# Patient Record
Sex: Male | Born: 1960 | Race: White | Hispanic: No | Marital: Married | Smoking: Former smoker
Health system: Southern US, Community
[De-identification: ages and names within clinical notes are randomized; demographics above are authoritative.]

## PROBLEM LIST (undated history)

## (undated) DIAGNOSIS — J45909 Unspecified asthma, uncomplicated: Secondary | ICD-10-CM

## (undated) DIAGNOSIS — E119 Type 2 diabetes mellitus without complications: Secondary | ICD-10-CM

## (undated) DIAGNOSIS — E7800 Pure hypercholesterolemia, unspecified: Secondary | ICD-10-CM

## (undated) DIAGNOSIS — I10 Essential (primary) hypertension: Secondary | ICD-10-CM

## (undated) HISTORY — PX: KNEE ARTHROSCOPY WITH PATELLAR TENDON REPAIR: SHX5656

## (undated) HISTORY — DX: Unspecified asthma, uncomplicated: J45.909

## (undated) HISTORY — PX: FOOT SURGERY: SHX648

## (undated) HISTORY — PX: WISDOM TOOTH EXTRACTION: SHX21

---

## 2016-09-12 ENCOUNTER — Emergency Department (HOSPITAL_COMMUNITY): Payer: 59

## 2016-09-12 ENCOUNTER — Emergency Department (HOSPITAL_COMMUNITY)
Admission: EM | Admit: 2016-09-12 | Discharge: 2016-09-12 | Disposition: A | Discharge status: Home / Self Care | Payer: 59 | Attending: Emergency Medicine | Admitting: Emergency Medicine

## 2016-09-12 ENCOUNTER — Encounter (HOSPITAL_COMMUNITY): Payer: Self-pay

## 2016-09-12 DIAGNOSIS — Y929 Unspecified place or not applicable: Secondary | ICD-10-CM | POA: Insufficient documentation

## 2016-09-12 DIAGNOSIS — R0789 Other chest pain: Secondary | ICD-10-CM | POA: Diagnosis present

## 2016-09-12 DIAGNOSIS — I10 Essential (primary) hypertension: Secondary | ICD-10-CM | POA: Insufficient documentation

## 2016-09-12 DIAGNOSIS — X58XXXA Exposure to other specified factors, initial encounter: Secondary | ICD-10-CM | POA: Diagnosis not present

## 2016-09-12 DIAGNOSIS — Y990 Civilian activity done for income or pay: Secondary | ICD-10-CM | POA: Diagnosis not present

## 2016-09-12 DIAGNOSIS — Z7984 Long term (current) use of oral hypoglycemic drugs: Secondary | ICD-10-CM | POA: Insufficient documentation

## 2016-09-12 DIAGNOSIS — Y9301 Activity, walking, marching and hiking: Secondary | ICD-10-CM | POA: Diagnosis not present

## 2016-09-12 DIAGNOSIS — E119 Type 2 diabetes mellitus without complications: Secondary | ICD-10-CM | POA: Insufficient documentation

## 2016-09-12 DIAGNOSIS — Z7982 Long term (current) use of aspirin: Secondary | ICD-10-CM | POA: Diagnosis not present

## 2016-09-12 DIAGNOSIS — R079 Chest pain, unspecified: Secondary | ICD-10-CM

## 2016-09-12 HISTORY — DX: Pure hypercholesterolemia, unspecified: E78.00

## 2016-09-12 HISTORY — DX: Essential (primary) hypertension: I10

## 2016-09-12 HISTORY — DX: Type 2 diabetes mellitus without complications: E11.9

## 2016-09-12 LAB — CBC
HCT: 47.2 % (ref 39.0–52.0)
Hemoglobin: 15.8 g/dL (ref 13.0–17.0)
MCH: 28.4 pg (ref 26.0–34.0)
MCHC: 33.5 g/dL (ref 30.0–36.0)
MCV: 84.9 fL (ref 78.0–100.0)
Platelets: 187 10*3/uL (ref 150–400)
RBC: 5.56 MIL/uL (ref 4.22–5.81)
RDW: 12.9 % (ref 11.5–15.5)
WBC: 6.8 10*3/uL (ref 4.0–10.5)

## 2016-09-12 LAB — BASIC METABOLIC PANEL
Anion gap: 8 (ref 5–15)
BUN: 13 mg/dL (ref 6–20)
CO2: 29 mmol/L (ref 22–32)
Calcium: 9.3 mg/dL (ref 8.9–10.3)
Chloride: 103 mmol/L (ref 101–111)
Creatinine, Ser: 0.89 mg/dL (ref 0.61–1.24)
GFR calc Af Amer: >60 mL/min (ref >60)
GFR calc non Af Amer: >60 mL/min (ref >60)
Glucose, Bld: 149 mg/dL — ABNORMAL HIGH (ref 65–99)
Potassium: 4 mmol/L (ref 3.5–5.1)
Sodium: 140 mmol/L (ref 135–145)

## 2016-09-12 LAB — I-STAT TROPONIN, ED
Troponin i, poc: 0 ng/mL (ref 0.00–0.08)
Troponin i, poc: 0 ng/mL (ref 0.00–0.08)

## 2016-09-12 MED ORDER — ASPIRIN 81 MG PO CHEW
324.0000 mg | CHEWABLE_TABLET | Freq: Once | ORAL | Status: AC
  Administered 2016-09-12: 324 mg via ORAL
  Filled 2016-09-12: qty 4

## 2016-09-12 MED ORDER — NITROGLYCERIN 0.4 MG SL SUBL
0.4000 mg | SUBLINGUAL_TABLET | SUBLINGUAL | Status: DC | PRN
  Administered 2016-09-12: 0.4 mg via SUBLINGUAL
  Filled 2016-09-12: qty 1

## 2016-09-12 MED ORDER — ASPIRIN EC 81 MG PO TBEC: 81.0000 mg | DELAYED_RELEASE_TABLET | Freq: Every day | ORAL | 0 refills | Status: AC

## 2016-09-12 NOTE — Discharge Instructions (Signed)
Read the information below.  You may return to the Emergency Department at any time for worsening condition or any new symptoms that concern you.   If you develop worsening chest pain, shortness of breath, fever, you pass out, or become weak or dizzy, return to the ER for a recheck.    °

## 2016-09-12 NOTE — ED Provider Notes (Signed)
[facility]-EMERGENCY DEPT Provider Note   CSN: [id number] Arrival date & time: 09/12/16  1428     History   Chief Complaint Chief Complaint  Patient presents with  . Chest Pain  . Dizziness    HPI Vincent Hawkins is a 55 y.o. male.  HPI   Patient with hx HTN, HLD, DM p/w left sided chest tightness, left arm tingling, lightheadedness that occurred while he was walking around at work, at 2:30pm.  He has improved somewhat but continues to have mild tightness in the left chest.  No exacerbating or palliative symptoms.  Has had these symptoms previously 12 years ago, had treadmill stress test at the time that was negative.    Family hx GF with 3 MIs in his early 31s Denies SOB, orthopnea, leg swelling, recent immobilization, personal or family hx blood clots.     PCP [facility]     Past Medical History:  Diagnosis Date  . Diabetes mellitus without complication (HCC)   . High cholesterol   . Hypertension     There are no active problems to display for this patient.   Past Surgical History:  Procedure Laterality Date  . FOOT SURGERY Right        Home Medications    Prior to Admission medications   Medication Sig Start Date End Date Taking? Authorizing Provider  ASA-APAP-Salicyl-Caff (PAINAID PO) Take 2 tablets by mouth daily.   Yes Historical Provider, MD  Aspirin-Caffeine (BC FAST PAIN RELIEF PO) Take 1 packet by mouth daily as needed. Once daily as needed   Yes Historical Provider, MD  atorvastatin (LIPITOR) 40 MG tablet Take 40 mg by mouth daily.   Yes Historical Provider, MD  linagliptin (TRADJENTA) 5 MG TABS tablet Take 5 mg by mouth daily.   Yes Historical Provider, MD  metFORMIN (GLUCOPHAGE) 1000 MG tablet Take 1,000 mg by mouth 2 (two) times daily with a meal.   Yes Historical Provider, MD  Multiple Vitamins-Minerals (MULTIVITAMIN ADULT PO) Take 1 tablet by mouth daily.   Yes Historical Provider, MD  ramipril (ALTACE) 5 MG capsule Take 5 mg by mouth daily.    Yes Historical Provider, MD  aspirin EC 81 MG tablet Take 1 tablet (81 mg total) by mouth daily. 09/12/16   Trixie Dredge, PA-C    Family History History reviewed. No pertinent family history.  Social History Social History  Substance Use Topics  . Smoking status: Never Smoker  . Smokeless tobacco: Never Used  . Alcohol use Yes     Allergies   Codeine   Review of Systems Review of Systems  All other systems reviewed and are negative.    Physical Exam Updated Vital Signs BP 137/91   Pulse 76   Temp 97.9 F (36.6 C) (Oral)   Resp 10   Ht 5\' 10"  (1.778 m)   Wt 93 kg   SpO2 96%   BMI 29.41 kg/m   Physical Exam  Constitutional: He appears well-developed and well-nourished. No distress.  HENT:  Head: Normocephalic and atraumatic.  Neck: Neck supple.  Cardiovascular: Normal rate, regular rhythm and intact distal pulses.   Pulmonary/Chest: Effort normal and breath sounds normal. No respiratory distress. He has no wheezes. He has no rales.  Abdominal: Soft. He exhibits no distension and no mass. There is no tenderness. There is no rebound and no guarding.  Musculoskeletal: He exhibits no edema.  Neurological: He is alert. He exhibits normal muscle tone.  Skin: He is not diaphoretic.  Nursing note  and vitals reviewed.    ED Treatments / Results  Labs (all labs ordered are listed, but only abnormal results are displayed) Labs Reviewed  BASIC METABOLIC PANEL - Abnormal; Notable for the following:       Result Value   Glucose, Bld 149 (*)    All other components within normal limits  CBC  I-STAT TROPOININ, ED  I-STAT TROPOININ, ED  Rosezena SensorI-STAT TROPOININ, ED    EKG  EKG Interpretation  Date/Time:  Tuesday September 12 2016 17:39:27 EDT Ventricular Rate:  85 PR Interval:    QRS Duration: 91 QT Interval:  363 QTC Calculation: 432 R Axis:   1 Text Interpretation:  Sinus rhythm Since last tracing of earlier today No significant change was found Confirmed by Effie ShyWENTZ   MD, ELLIOTT [phone]([id number]) on 09/12/2016 6:00:25 PM       Radiology Dg Chest 2 View  Result Date: 09/12/2016 CLINICAL DATA:  Chest pain EXAM: CHEST  2 VIEW COMPARISON:  None. FINDINGS: The heart size and mediastinal contours are within normal limits. Both lungs are clear. The visualized skeletal structures are unremarkable. IMPRESSION: No active cardiopulmonary disease. Electronically Signed   By: Marlan Palauharles  Clark M.D.   On: 09/12/2016 15:47    Procedures Procedures (including critical care time)  Medications Ordered in ED Medications  nitroGLYCERIN (NITROSTAT) SL tablet 0.4 mg (0.4 mg Sublingual Given 09/12/16 1808)  aspirin chewable tablet 324 mg (324 mg Oral Given 09/12/16 1806)     Initial Impression / Assessment and Plan / ED Course  I have reviewed the triage vital signs and the nursing notes.  Pertinent labs & imaging results that were available during my care of the patient were reviewed by me and considered in my medical decision making (see chart for details).  Clinical Course  Comment By Time  Discussed workup with patient and offered patient admission.  He declined.  Heart score is 4.  I have engaged in joint medical decision making with the patient at length, discussing my concerns and the benefits of being admitted to the hospital.  He continues to decline but verbalizes understanding of my concerns.  Prefers to follow up with his PCP tomorrow and will call cardiology for follow up as well.  Willing to stay for second troponin.   Trixie Dredgemily Macklyn Glandon, PA-C 10/24 1904    Pt with HTN, HLD, DM p/w left sided chest pain, left arm tingling, lightheadedness that began at 2:30pm.  Pt has nonischemic EKG, troponins x 2 are negative.  No known risk factors for PE.  HEART score is 4.  Discussed pt with Dr Juleen ChinaKohut.  I have offered pt admission for observation but pt declines.  Please see discussion above.  Pt d/c home with PCP and cardiology follow up.  Strongly encouraged close follow up and return  for any worsening symptoms.  Pt chest pain free for the several hours prior to discharge.  Discussed result, findings, treatment, and follow up  with patient.  Pt given return precautions.  Pt verbalizes understanding and agrees with plan.      Final Clinical Impressions(s) / ED Diagnoses   Final diagnoses:  Chest pain, unspecified type    New Prescriptions New Prescriptions   ASPIRIN EC 81 MG TABLET    Take 1 tablet (81 mg total) by mouth daily.     Trixie Dredgemily Reda Citron, PA-C 09/12/16 2034    Raeford RazorStephen Kohut, MD 09/13/16 [phone]

## 2016-09-12 NOTE — ED Triage Notes (Signed)
Pt c/o L chest tightness, lightheadedness, and tingling in L arm starting this afternoon.  Pain score 3/10.  Pt reports similar symptoms previously w/o diagnosis.  Hx of high cholesterol, HTN, and DM.

## 2016-09-19 ENCOUNTER — Encounter: Payer: Self-pay | Admitting: Physician Assistant

## 2016-09-20 ENCOUNTER — Encounter: Payer: Self-pay | Admitting: Physician Assistant

## 2016-09-20 ENCOUNTER — Ambulatory Visit (INDEPENDENT_AMBULATORY_CARE_PROVIDER_SITE_OTHER): Payer: 59 | Admitting: Physician Assistant

## 2016-09-20 VITALS — BP 142/90 | HR 85 | Ht 70.0 in | Wt 206.4 lb

## 2016-09-20 DIAGNOSIS — R079 Chest pain, unspecified: Secondary | ICD-10-CM

## 2016-09-20 DIAGNOSIS — I10 Essential (primary) hypertension: Secondary | ICD-10-CM | POA: Diagnosis not present

## 2016-09-20 DIAGNOSIS — E118 Type 2 diabetes mellitus with unspecified complications: Secondary | ICD-10-CM | POA: Diagnosis not present

## 2016-09-20 DIAGNOSIS — E785 Hyperlipidemia, unspecified: Secondary | ICD-10-CM

## 2016-09-20 NOTE — Patient Instructions (Addendum)
Medication Instructions:  Your physician recommends that you continue on your current medications as directed. Please refer to the Current Medication list given to you today.   Labwork: None ordered  Testing/Procedures: Your physician has requested that you have en exercise stress myoview. For further information please visit [email or web address]. Please follow instruction sheet, as given.   Follow-Up: Your physician recommends that you schedule a follow-up appointment in: BASED UPON TEST RESULTS  Any Other Special Instructions Will Be Listed Below (If Applicable).    Exercise Stress Electrocardiogram An exercise stress electrocardiogram is a test that is done to evaluate the blood supply to your heart. This test may also be called exercise stress electrocardiography. The test is done while you are walking on a treadmill. The goal of this test is to raise your heart rate. This test is done to find areas of poor blood flow to the heart by determining the extent of coronary artery disease (CAD).   CAD is defined as narrowing in one or more heart (coronary) arteries of more than 70%. If you have an abnormal test result, this may mean that you are not getting adequate blood flow to your heart during exercise. Additional testing may be needed to understand why your test was abnormal. LET [facility] PROVIDER KNOW ABOUT:   Any allergies you have.  All medicines you are taking, including vitamins, herbs, eye drops, creams, and over-the-counter medicines.  Previous problems you or members of your family have had with the use of anesthetics.  Any blood disorders you have.  Previous surgeries you have had.  Medical conditions you have.  Possibility of pregnancy, if this applies. RISKS AND COMPLICATIONS Generally, this is a safe procedure. However, as with any procedure, complications can occur. Possible complications can include:  Pain or pressure in the following  areas:  Chest.  Jaw or neck.  Between your shoulder blades.  Radiating down your left arm.  Dizziness or light-headedness.  Shortness of breath.  Increased or irregular heartbeats.  Nausea or vomiting.  Heart attack (rare). BEFORE THE PROCEDURE  Avoid all forms of caffeine 24 hours before your test or as directed by your health care provider. This includes coffee, tea (even decaffeinated tea), caffeinated sodas, chocolate, cocoa, and certain pain medicines.  Follow your health care provider's instructions regarding eating and drinking before the test.  Take your medicines as directed at regular times with water unless instructed otherwise. Exceptions may include:  If you have diabetes, ask how you are to take your insulin or pills. It is common to adjust insulin dosing the morning of the test.  If you are taking beta-blocker medicines, it is important to talk to your health care provider about these medicines well before the date of your test. Taking beta-blocker medicines may interfere with the test. In some cases, these medicines need to be changed or stopped 24 hours or more before the test.  If you wear a nitroglycerin patch, it may need to be removed prior to the test. Ask your health care provider if the patch should be removed before the test.  If you use an inhaler for any breathing condition, bring it with you to the test.  If you are an outpatient, bring a snack so you can eat right after the stress phase of the test.  Do not smoke for 4 hours prior to the test or as directed by your health care provider.  Do not apply lotions, powders, creams, or oils on your chest prior  to the test.  Wear loose-fitting clothes and comfortable shoes for the test. This test involves walking on a treadmill. PROCEDURE  Multiple patches (electrodes) will be put on your chest. If needed, small areas of your chest may have to be shaved to get better contact with the electrodes. Once  the electrodes are attached to your body, multiple wires will be attached to the electrodes and your heart rate will be monitored.  Your heart will be monitored both at rest and while exercising.  You will walk on a treadmill. The treadmill will be started at a slow pace. The treadmill speed and incline will gradually be increased to raise your heart rate. AFTER THE PROCEDURE  Your heart rate and blood pressure will be monitored after the test.  You may return to your normal schedule including diet, activities, and medicines, unless your health care provider tells you otherwise.   This information is not intended to replace advice given to you by your health care provider. Make sure you discuss any questions you have with your health care provider.   Document Released: 11/03/2000 Document Revised: 11/11/2013 Document Reviewed: 07/14/2013 Elsevier Interactive Patient Education [employer]2016 Elsevier Inc.   If you need a refill on your cardiac medications before your next appointment, please call your pharmacy.

## 2016-09-20 NOTE — Progress Notes (Signed)
Cardiology Office Note    Date:  09/20/2016   ID:  Theophilus Kinds, DOB 1961-03-26, MRN [id number]  PCP:  Katy Apo, MD  Cardiologist:  New  CC: chest pain- post ER follow up.   History of Present Illness:  Vincent Hawkins is a 55 y.o. male with a history of HTN, HLD, DMT2, and family history of CAD who presents to clinic for new cardiology evaluation.   Family hx GF with 3 MIs in his early 46s. Other family with no heart disease. He was seen in the ER on 09/12/16 with left sided chest pain, left arm tingling, and lightheadedness. ECG was non ischemic and delta troponin negative. He was offered overnight observation but declined and wanted to follow up as an outpatient.  Today he presents to clinic for follow up. Last week he had an episode of chest tightness like a "cramp" in his left chest and left arm tingling as well as lightheadedness. It lasted about an hour. He was at work when it occurred and he was told to go to the ER. By the time he got to the Er it was better and then it resolved. He had a couple of similar episodes a couple years ago and had a stress test that was okay. 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 No exertional chest pain or SOB. He recently went on a family vacation and did a lot hiking and did well. Was walking 20 thousand steps a day. No formal exercise. He has been diabetic for 4 years and last HgA1c was 7.7. Waiting on most recent. No palpitations, no LE edema, orthopnea or PND. No dizziness or syncope.   He takes his AM pills but often forgets his nighttime dose of Metfomin. He does not smoke but socially smoked in 5 years in teens. Moderate alcohol intake. 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     Past Medical History:  Diagnosis Date  . Asthma    AS CHILD  . Diabetes mellitus without complication (HCC)   . High cholesterol   .  Hypertension     Past Surgical History:  Procedure Laterality Date  . FOOT SURGERY Right   . KNEE ARTHROSCOPY WITH PATELLAR TENDON REPAIR Right   . WISDOM TOOTH EXTRACTION      Current Medications: Outpatient Medications Prior to Visit  Medication Sig Dispense Refill  . ASA-APAP-Salicyl-Caff (PAINAID PO) Take 2 tablets by mouth daily.    Marland Kitchen aspirin EC 81 MG tablet Take 1 tablet (81 mg total) by mouth daily. 30 tablet 0  . Aspirin-Caffeine (BC FAST PAIN RELIEF PO) Take 1 packet by mouth daily as needed (pain).     Marland Kitchen atorvastatin (LIPITOR) 40 MG tablet Take 40 mg by mouth daily.    Marland Kitchen linagliptin (TRADJENTA) 5 MG TABS tablet Take 5 mg by mouth daily.    . metFORMIN (GLUCOPHAGE) 1000 MG tablet Take 1,000 mg by mouth 2 (two) times daily with a meal.    . Multiple Vitamins-Minerals (MULTIVITAMIN ADULT PO) Take 1 tablet by mouth daily.    . ramipril (ALTACE) 5 MG capsule Take 5 mg by mouth daily.     No facility-administered medications prior to visit.      Allergies:   Codeine   Social History   Social History  . Marital status: Married    Spouse name: N/A  .  Number of children: 2  . Years of education: N/A   Occupational History  . RESTURANT MANAGER    Social History Main Topics  . Smoking status: Former Games developermoker  . Smokeless tobacco: Former NeurosurgeonUser    Types: Chew  . Alcohol use Yes  . Drug use: No  . Sexual activity: Not Asked   Other Topics Concern  . None   Social History Narrative  . None     Family History:  The patient's*family history includes Colon polyps in his father; Congestive Heart Failure in his paternal grandfather; Hypertension in his brother.     ROS:   Please see the history of present illness.    ROS All other systems reviewed and are negative.   PHYSICAL EXAM:   VS:  BP (!) 142/90 (BP Location: Right Arm)   Pulse 85   Ht 5\' 10"  (1.778 m)   Wt 206 lb 6.4 oz (93.6 kg)   BMI 29.62 kg/m    GEN: Well nourished, well developed, in no acute  distress  HEENT: normal  Neck: no JVD, carotid bruits, or masses Cardiac: RRR; no murmurs, rubs, or gallops,no edema  Respiratory:  clear to auscultation bilaterally, normal work of breathing GI: soft, nontender, nondistended, + BS MS: no deformity or atrophy  Skin: warm and dry, no rash Neuro:  Alert and Oriented x 3, Strength and sensation are intact Psych: euthymic mood, full affect  Wt Readings from Last 3 Encounters:  09/20/16 206 lb 6.4 oz (93.6 kg)  09/12/16 205 lb (93 kg)      Studies/Labs Reviewed:   EKG:  EKG is ordered today.  The ekg ordered today demonstrates sinus with PVC HR 85  Recent Labs: 09/12/2016: BUN 13; Creatinine, Ser 0.89; Hemoglobin 15.8; Platelets 187; Potassium 4.0; Sodium 140   Lipid Panel No results found for: CHOL, TRIG, HDL, CHOLHDL, VLDL, LDLCALC, LDLDIRECT  Additional studies/ records that were reviewed today include:  CXR 09/12/16 IMPRESSION: No active cardiopulmonary disease.   ASSESSMENT & PLAN:   Chest pain: his chest pain is atypical but does have RFS for CAD including HTN ,HLD, DMT2 and family hx in GF. Will further risk stratify with an exercise nuclear stress test. Continue ASA 81 mg daily for primary prevention.   HTN: BP well controlled currently  HLD: continue statin   DMT2: continue current regimen   Medication Adjustments/Labs and Tests Ordered: Current medicines are reviewed at length with the patient today.  Concerns regarding medicines are outlined above.  Medication changes, Labs and Tests ordered today are listed in the Patient Instructions below. Patient Instructions  Medication Instructions:  Your physician recommends that you continue on your current medications as directed. Please refer to the Current Medication list given to you today.   Labwork: None ordered  Testing/Procedures: Your physician has requested that you have en exercise stress myoview. For further information please visit [email or web address].  Please follow instruction sheet, as given.   Follow-Up: Your physician recommends that you schedule a follow-up appointment in: 2 WEEKS WITH KATIE Brigid Vandekamp, PA-C AFTER THE STRESS TEST    Any Other Special Instructions Will Be Listed Below (If Applicable).    Exercise Stress Electrocardiogram An exercise stress electrocardiogram is a test that is done to evaluate the blood supply to your heart. This test may also be called exercise stress electrocardiography. The test is done while you are walking on a treadmill. The goal of this test is to raise your heart rate. This test is done to find  areas of poor blood flow to the heart by determining the extent of coronary artery disease (CAD).   CAD is defined as narrowing in one or more heart (coronary) arteries of more than 70%. If you have an abnormal test result, this may mean that you are not getting adequate blood flow to your heart during exercise. Additional testing may be needed to understand why your test was abnormal. LET [facility]OUR HEALTH CARE PROVIDER KNOW ABOUT:   Any allergies you have.  All medicines you are taking, including vitamins, herbs, eye drops, creams, and over-the-counter medicines.  Previous problems you or members of your family have had with the use of anesthetics.  Any blood disorders you have.  Previous surgeries you have had.  Medical conditions you have.  Possibility of pregnancy, if this applies. RISKS AND COMPLICATIONS Generally, this is a safe procedure. However, as with any procedure, complications can occur. Possible complications can include:  Pain or pressure in the following areas:  Chest.  Jaw or neck.  Between your shoulder blades.  Radiating down your left arm.  Dizziness or light-headedness.  Shortness of breath.  Increased or irregular heartbeats.  Nausea or vomiting.  Heart attack (rare). BEFORE THE PROCEDURE  Avoid all forms of caffeine 24 hours before your test or as directed by  your health care provider. This includes coffee, tea (even decaffeinated tea), caffeinated sodas, chocolate, cocoa, and certain pain medicines.  Follow your health care provider's instructions regarding eating and drinking before the test.  Take your medicines as directed at regular times with water unless instructed otherwise. Exceptions may include:  If you have diabetes, ask how you are to take your insulin or pills. It is common to adjust insulin dosing the morning of the test.  If you are taking beta-blocker medicines, it is important to talk to your health care provider about these medicines well before the date of your test. Taking beta-blocker medicines may interfere with the test. In some cases, these medicines need to be changed or stopped 24 hours or more before the test.  If you wear a nitroglycerin patch, it may need to be removed prior to the test. Ask your health care provider if the patch should be removed before the test.  If you use an inhaler for any breathing condition, bring it with you to the test.  If you are an outpatient, bring a snack so you can eat right after the stress phase of the test.  Do not smoke for 4 hours prior to the test or as directed by your health care provider.  Do not apply lotions, powders, creams, or oils on your chest prior to the test.  Wear loose-fitting clothes and comfortable shoes for the test. This test involves walking on a treadmill. PROCEDURE  Multiple patches (electrodes) will be put on your chest. If needed, small areas of your chest may have to be shaved to get better contact with the electrodes. Once the electrodes are attached to your body, multiple wires will be attached to the electrodes and your heart rate will be monitored.  Your heart will be monitored both at rest and while exercising.  You will walk on a treadmill. The treadmill will be started at a slow pace. The treadmill speed and incline will gradually be increased to  raise your heart rate. AFTER THE PROCEDURE  Your heart rate and blood pressure will be monitored after the test.  You may return to your normal schedule including diet, activities, and medicines,  unless your health care provider tells you otherwise.   This information is not intended to replace advice given to you by your health care provider. Make sure you discuss any questions you have with your health care provider.   Document Released: 11/03/2000 Document Revised: 11/11/2013 Document Reviewed: 07/14/2013 Elsevier Interactive Patient Education [employer]   If you need a refill on your cardiac medications before your next appointment, please call your pharmacy.      Signed, Cline Crock, PA-C  09/20/2016 11:39 AM    [facility] [address] [place], [place], [place]  [address] Phone: [phone]; Fax: [phone]

## 2016-09-27 ENCOUNTER — Telehealth (HOSPITAL_COMMUNITY): Payer: Self-pay | Admitting: *Deleted

## 2016-09-27 NOTE — Telephone Encounter (Signed)
Left message on voicemail in reference to upcoming appointment scheduled for 10/02/16. Phone number given for a call back so details instructions can be given.  Vincent Hawkins Naythan Douthit, RN

## 2016-10-02 ENCOUNTER — Encounter (HOSPITAL_COMMUNITY): Payer: 59

## 2016-10-17 ENCOUNTER — Telehealth (HOSPITAL_COMMUNITY): Payer: Self-pay | Admitting: *Deleted

## 2016-10-17 NOTE — Telephone Encounter (Signed)
Left message on voicemail in reference to upcoming appointment scheduled for 10/19/16. Phone number given for a call back so details instructions can be given. Ricky AlaSmith, Jonavan Vanhorn Jacqueline

## 2016-10-18 ENCOUNTER — Telehealth (HOSPITAL_COMMUNITY): Payer: Self-pay

## 2016-10-18 NOTE — Telephone Encounter (Signed)
Patient given detailed instructions per Myocardial Perfusion Study Information Sheet for the test on 10/19/2016 at 7:30. Patient notified to arrive 15 minutes early and that it is imperative to arrive on time for appointment to keep from having the test rescheduled.  If you need to cancel or reschedule your appointment, please call the office within 24 hours of your appointment. Failure to do so may result in a cancellation of your appointment, and a $50 no show fee. Patient verbalized understanding.EHK

## 2016-10-19 ENCOUNTER — Ambulatory Visit (HOSPITAL_COMMUNITY): Payer: 59 | Attending: Internal Medicine

## 2016-10-19 DIAGNOSIS — E119 Type 2 diabetes mellitus without complications: Secondary | ICD-10-CM | POA: Insufficient documentation

## 2016-10-19 DIAGNOSIS — I10 Essential (primary) hypertension: Secondary | ICD-10-CM | POA: Diagnosis not present

## 2016-10-19 DIAGNOSIS — R079 Chest pain, unspecified: Secondary | ICD-10-CM | POA: Diagnosis not present

## 2016-10-19 LAB — MYOCARDIAL PERFUSION IMAGING
CHL CUP NUCLEAR SDS: 3
CHL CUP NUCLEAR SRS: 1
CHL CUP NUCLEAR SSS: 4
CSEPEDS: 0 s
CSEPEW: 11.7 METS
CSEPHR: 92 %
CSEPPHR: 155 {beats}/min
Exercise duration (min): 10 min
LV dias vol: 91 mL (ref 62–150)
LV sys vol: 44 mL
MPHR: 167 {beats}/min
RATE: 0.32
Rest HR: 78 {beats}/min
TID: 0.91

## 2016-10-19 MED ORDER — TECHNETIUM TC 99M TETROFOSMIN IV KIT
32.6000 | PACK | Freq: Once | INTRAVENOUS | Status: AC | PRN
  Administered 2016-10-19: 32.6 via INTRAVENOUS
  Filled 2016-10-19: qty 33

## 2016-10-19 MED ORDER — TECHNETIUM TC 99M TETROFOSMIN IV KIT
10.2000 | PACK | Freq: Once | INTRAVENOUS | Status: AC | PRN
  Administered 2016-10-19: 10.2 via INTRAVENOUS
  Filled 2016-10-19: qty 11

## 2016-10-24 ENCOUNTER — Ambulatory Visit: Payer: 59 | Admitting: Physician Assistant

## 2016-10-30 NOTE — Progress Notes (Signed)
Cardiology Office Note    Date:  11/01/2016   ID:  Vincent Hawkins, DOB Mar 22, 1961, MRN [id number]  PCP:  Katy ApoPOLITE,RONALD D, MD  Cardiologist:  New- Dr. Elease HashimotoNahser  CC: abnormal nuc- discuss cath  History of Present Illness:  Vincent KindsJohn Casso is a 55 y.o. male with a history of HTN, HLD, DMT2, and family history of CAD who presents to clinic to discuss an abnormal stress test.   Family hx GF with 3 MIs in his early 1950s. Other family with no heart disease. He was seen in the ER on 09/12/16 with left sided chest pain, left arm tingling, and lightheadedness. ECG was non ischemic and delta troponin negative. He was offered overnight observation but declined and wanted to follow up as an outpatient.  I saw him in clinic on 09/20/16 for evaluation of chest pain after being seen in the ER. I felt like his chest pain was atypical but with his many RFs for CAD we did a stress test. Nuclear stress test on 10/19/16 showed EF 52%, hypertensive response to exercise, medium size, moderate severity, reversible defect in the mid and apical inferior and basal and mid inferolateral walls consistent with ischemia in the LCX territory.  Today he presents to clinic to discuss abnormal nuc and possible cardiac cath. Dr. Melburn PopperNasher came into room to discuss myoview and symptoms. He has had no further chest pain but no real exercise. 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 No CP , SOB, LE edema, orthopnea or PND. No palpitations. No dizziness or syncope. No blood in stool or urine.    Past Medical History:  Diagnosis Date  . Asthma    AS CHILD  . Diabetes mellitus without complication (HCC)   . High cholesterol   . Hypertension     Past Surgical History:  Procedure Laterality Date  . FOOT SURGERY Right   . KNEE ARTHROSCOPY WITH PATELLAR TENDON REPAIR Right   . WISDOM TOOTH EXTRACTION      Current Medications: Outpatient Medications Prior to Visit  Medication Sig Dispense Refill  .  ASA-APAP-Salicyl-Caff (PAINAID PO) Take 2 tablets by mouth as needed.     Marland Kitchen. aspirin EC 81 MG tablet Take 1 tablet (81 mg total) by mouth daily. 30 tablet 0  . Aspirin-Caffeine (BC FAST PAIN RELIEF PO) Take 1 packet by mouth daily as needed (pain).     Marland Kitchen. atorvastatin (LIPITOR) 40 MG tablet Take 40 mg by mouth daily.    Marland Kitchen. linagliptin (TRADJENTA) 5 MG TABS tablet Take 5 mg by mouth daily.    . metFORMIN (GLUCOPHAGE) 1000 MG tablet Take 1,000 mg by mouth 2 (two) times daily with a meal.    . Multiple Vitamins-Minerals (MULTIVITAMIN ADULT PO) Take 1 tablet by mouth daily.    . ramipril (ALTACE) 5 MG capsule Take 5 mg by mouth daily.     No facility-administered medications prior to visit.      Allergies:   Codeine   Social History   Social History  . Marital status: Married    Spouse name: N/A  . Number of children: 2  . Years of education: N/A   Occupational History  . RESTURANT MANAGER    Social History Main Topics  . Smoking status: Former Games developermoker  . Smokeless tobacco: Former NeurosurgeonUser    Types: Chew  . Alcohol use Yes  . Drug use: No  . Sexual activity: Not Asked   Other Topics Concern  . None  Social History Narrative  . None     Family History:  The patient's*family history includes Colon polyps in his father; Congestive Heart Failure in his paternal grandfather; Hypertension in his brother.     ROS:   Please see the history of present illness.    ROS All other systems reviewed and are negative.   PHYSICAL EXAM:   VS:  BP (!) 126/92   Pulse 78   Ht 5\' 10"  (1.778 m)   Wt 206 lb 12.8 oz (93.8 kg)   BMI 29.67 kg/m    GEN: Well nourished, well developed, in no acute distress  HEENT: normal  Neck: no JVD, carotid bruits, or masses Cardiac: RRR; no murmurs, rubs, or gallops,no edema  Respiratory:  clear to auscultation bilaterally, normal work of breathing GI: soft, nontender, nondistended, + BS MS: no deformity or atrophy  Skin: warm and dry, no rash Neuro:  Alert  and Oriented x 3, Strength and sensation are intact Psych: euthymic mood, full affect  Wt Readings from Last 3 Encounters:  11/01/16 206 lb 12.8 oz (93.8 kg)  10/19/16 206 lb (93.4 kg)  09/20/16 206 lb 6.4 oz (93.6 kg)      Studies/Labs Reviewed:   EKG:  EKG is NOT ordered today.  Recent Labs: 09/12/2016: BUN 13; Creatinine, Ser 0.89; Hemoglobin 15.8; Platelets 187; Potassium 4.0; Sodium 140   Lipid Panel No results found for: CHOL, TRIG, HDL, CHOLHDL, VLDL, LDLCALC, LDLDIRECT  Additional studies/ records that were reviewed today include:  CXR 09/12/16 IMPRESSION: No active cardiopulmonary disease.   10/19/16 Exercise nuclear stress test .   Nuclear stress EF: 52%.  Blood pressure demonstrated a hypertensive response to exercise.  There was no ST segment deviation noted during stress.  Defect 1: There is a medium defect of moderate severity present in the basal inferolateral, mid inferior, mid inferolateral and apical inferior location.  Findings consistent with ischemia.  This is an intermediate risk study.  The left ventricular ejection fraction is mildly decreased (45-54%).   There is medium size, moderate severity, reversible defect in the mid and apical inferior and basal and mid inferolateral walls consistent with ischemia in the LCX territory.  (SDS = 5)      ASSESSMENT & PLAN:   Abnormal nuclear stress test: he was initially seen for chest pain that felt like a muscular cramp in his chest. Due to his many RFs and family history he was referred for nuclear stress test which showed possible inferolateral ischemia. He has not had anymore chest pain since our last visit. Dr. Elease HashimotoNahser reviewed stress test and strongly feels like patient should have cath despite lack of recurrent symptoms. Patient would like to defer cath until early January due to very busy holiday schedule at work. He has been given a Rx for SL NTG with ER precautions if chest pain returns and  is not resolved with nitro.   I have reviewed the risks, indications, and alternatives to cardiac catheterization and possible angioplasty/stenting with the patient. Risks include but are not limited to bleeding, infection, vascular injury, stroke, myocardial infection, arrhythmia, kidney injury, radiation-related injury in the case of prolonged fluoroscopy use, emergency cardiac surgery, and death. The patient understands the risks of serious complication is low (<1%).   Will bring back for pre cath labs in the week before his cath on 11/22/15 with Dr. Sanjuana KavaMcAlhaney. Continue ASA and statin.     HTN: BP well controlled currently  HLD: continue statin   DMT2: continue current  regimen   Medication Adjustments/Labs and Tests Ordered: Current medicines are reviewed at length with the patient today.  Concerns regarding medicines are outlined above.  Medication changes, Labs and Tests ordered today are listed in the Patient Instructions below. Patient Instructions  Medication Instructions:  Your physician has recommended you make the following change in your medication:  1.  START the Nitroglycerin 0.4 s/l tablets AS NEEDED AND USE AS DIRECTED  Labwork: 11/14/16   BMET, CBC, & PT/INR  Testing/Procedures: Your physician has requested that you have a cardiac catheterization. Cardiac catheterization is used to diagnose and/or treat various heart conditions. Doctors may recommend this procedure for a number of different reasons. The most common reason is to evaluate chest pain. Chest pain can be a symptom of coronary artery disease (CAD), and cardiac catheterization can show whether plaque is narrowing or blocking your heart's arteries. This procedure is also used to evaluate the valves, as well as measure the blood flow and oxygen levels in different parts of your heart. For further information please visit [email or web address]. Please follow instruction sheet, as given.   Follow-Up: Your physician  recommends that you schedule a follow-up appointment in: WILL BE SET UP AT DISCHARGE  Any Other Special Instructions Will Be Listed Below (If Applicable).   Coronary Angiogram With Stent Coronary angiogram with stent placement is a procedure to widen or open a narrow blood vessel of the heart (coronary artery). Arteries may become blocked by cholesterol buildup (plaques) in the lining or wall. When a coronary artery becomes partially blocked, blood flow to that area decreases. This may lead to chest pain or a heart attack (myocardial infarction). A stent is a small piece of metal that looks like mesh or a spring. Stent placement may be done as treatment for a heart attack or right after a coronary angiogram in which a blocked artery is found. Let your health care provider know about:  Any allergies you have.  All medicines you are taking, including vitamins, herbs, eye drops, creams, and over-the-counter medicines.  Any problems you or family members have had with anesthetic medicines.  Any blood disorders you have.  Any surgeries you have had.  Any medical conditions you have.  Whether you are pregnant or may be pregnant. What are the risks? Generally, this is a safe procedure. However, problems may occur, including:  Damage to the heart or its blood vessels.  A return of blockage.  Bleeding, infection, or bruising at the insertion site.  A collection of blood under the skin (hematoma) at the insertion site.  A blood clot in another part of the body.  Kidney injury.  Allergic reaction to the dye or contrast that is used.  Bleeding into the abdomen (retroperitoneal bleeding). What happens before the procedure? Staying hydrated  Follow instructions from your health care provider about hydration, which may include:  Up to 2 hours before the procedure - you may continue to drink clear liquids, such as water, clear fruit juice, black coffee, and plain tea. Eating and  drinking restrictions  Follow instructions from your health care provider about eating and drinking, which may include:  8 hours before the procedure - stop eating heavy meals or foods such as meat, fried foods, or fatty foods.  6 hours before the procedure - stop eating light meals or foods, such as toast or cereal.  2 hours before the procedure - stop drinking clear liquids. Ask your health care provider about:  Changing or stopping your regular medicines.  This is especially important if you are taking diabetes medicines or blood thinners.  Taking medicines such as ibuprofen. These medicines can thin your blood. Do not take these medicines before your procedure if your health care provider instructs you not to. Generally, aspirin is recommended before a procedure of passing a small, thin tube (catheter) through a blood vessel and into the heart (cardiac catheterization). What happens during the procedure?  An IV tube will be inserted into one of your veins.  You will be given one or more of the following:  A medicine to help you relax (sedative).  A medicine to numb the area where the catheter will be inserted into an artery (local anesthetic).  To reduce your risk of infection:  Your health care team will wash or sanitize their hands.  Your skin will be washed with soap.  Hair may be removed from the area where the catheter will be inserted.  Using a guide wire, the catheter will be inserted into an artery. The location may be in your groin, in your wrist, or in the fold of your arm (near your elbow).  A type of X-ray (fluoroscopy) will be used to help guide the catheter to the opening of the arteries in the heart.  A dye will be injected into the catheter, and X-rays will be taken. The dye will help to show where any narrowing or blockages are located in the arteries.  A tiny wire will be guided to the blocked spot, and a balloon will be inflated to make the artery  wider.  The stent will be expanded and will crush the plaques into the wall of the vessel. The stent will hold the area open and improve the blood flow. Most stents have a drug coating to reduce the risk of the stent narrowing over time.  The artery may be made wider using a drill, laser, or other tools to remove plaques.  When the blood flow is better, the catheter will be removed. The lining of the artery will grow over the stent, which stays where it was placed. This procedure may vary among health care providers and hospitals. What happens after the procedure?  If the procedure is done through the leg, you will be kept in bed lying flat for about 6 hours. You will be instructed to not bend and not cross your legs.  The insertion site will be checked frequently.  The pulse in your foot or wrist will be checked frequently.  You may have additional blood tests, X-rays, and a test that records the electrical activity of your heart (electrocardiogram, or ECG). This information is not intended to replace advice given to you by your health care provider. Make sure you discuss any questions you have with your health care provider. Document Released: 05/13/2003 Document Revised: 07/06/2016 Document Reviewed: 06/11/2016 Elsevier Interactive Patient Education  2017 [employer].    If you need a refill on your cardiac medications before your next appointment, please call your pharmacy.      Signed, Cline Crock, PA-C  11/01/2016 12:17 PM    [facility] [address] [place], [place], [place]  [address] Phone: [phone]; Fax: [phone]

## 2016-11-01 ENCOUNTER — Encounter: Payer: Self-pay | Admitting: Physician Assistant

## 2016-11-01 ENCOUNTER — Ambulatory Visit (INDEPENDENT_AMBULATORY_CARE_PROVIDER_SITE_OTHER): Payer: 59 | Admitting: Physician Assistant

## 2016-11-01 ENCOUNTER — Encounter: Payer: Self-pay | Admitting: *Deleted

## 2016-11-01 VITALS — BP 126/92 | HR 78 | Ht 70.0 in | Wt 206.8 lb

## 2016-11-01 DIAGNOSIS — I10 Essential (primary) hypertension: Secondary | ICD-10-CM | POA: Diagnosis not present

## 2016-11-01 DIAGNOSIS — E118 Type 2 diabetes mellitus with unspecified complications: Secondary | ICD-10-CM

## 2016-11-01 DIAGNOSIS — R9439 Abnormal result of other cardiovascular function study: Secondary | ICD-10-CM

## 2016-11-01 DIAGNOSIS — E785 Hyperlipidemia, unspecified: Secondary | ICD-10-CM

## 2016-11-01 MED ORDER — NITROGLYCERIN 0.4 MG SL SUBL: 0.4000 mg | SUBLINGUAL_TABLET | SUBLINGUAL | 3 refills | Status: AC | PRN

## 2016-11-01 NOTE — Patient Instructions (Addendum)
Medication Instructions:  Your physician has recommended you make the following change in your medication:  1.  START the Nitroglycerin 0.4 s/l tablets AS NEEDED AND USE AS DIRECTED  Labwork: 11/14/16   BMET, CBC, & PT/INR  Testing/Procedures: Your physician has requested that you have a cardiac catheterization. Cardiac catheterization is used to diagnose and/or treat various heart conditions. Doctors may recommend this procedure for a number of different reasons. The most common reason is to evaluate chest pain. Chest pain can be a symptom of coronary artery disease (CAD), and cardiac catheterization can show whether plaque is narrowing or blocking your heart's arteries. This procedure is also used to evaluate the valves, as well as measure the blood flow and oxygen levels in different parts of your heart. For further information please visit [email or web address]. Please follow instruction sheet, as given.   Follow-Up: Your physician recommends that you schedule a follow-up appointment in: WILL BE SET UP AT DISCHARGE  Any Other Special Instructions Will Be Listed Below (If Applicable).   Coronary Angiogram With Stent Coronary angiogram with stent placement is a procedure to widen or open a narrow blood vessel of the heart (coronary artery). Arteries may become blocked by cholesterol buildup (plaques) in the lining or wall. When a coronary artery becomes partially blocked, blood flow to that area decreases. This may lead to chest pain or a heart attack (myocardial infarction). A stent is a small piece of metal that looks like mesh or a spring. Stent placement may be done as treatment for a heart attack or right after a coronary angiogram in which a blocked artery is found. Let your health care provider know about:  Any allergies you have.  All medicines you are taking, including vitamins, herbs, eye drops, creams, and over-the-counter medicines.  Any problems you or family members have had  with anesthetic medicines.  Any blood disorders you have.  Any surgeries you have had.  Any medical conditions you have.  Whether you are pregnant or may be pregnant. What are the risks? Generally, this is a safe procedure. However, problems may occur, including:  Damage to the heart or its blood vessels.  A return of blockage.  Bleeding, infection, or bruising at the insertion site.  A collection of blood under the skin (hematoma) at the insertion site.  A blood clot in another part of the body.  Kidney injury.  Allergic reaction to the dye or contrast that is used.  Bleeding into the abdomen (retroperitoneal bleeding). What happens before the procedure? Staying hydrated  Follow instructions from your health care provider about hydration, which may include:  Up to 2 hours before the procedure - you may continue to drink clear liquids, such as water, clear fruit juice, black coffee, and plain tea. Eating and drinking restrictions  Follow instructions from your health care provider about eating and drinking, which may include:  8 hours before the procedure - stop eating heavy meals or foods such as meat, fried foods, or fatty foods.  6 hours before the procedure - stop eating light meals or foods, such as toast or cereal.  2 hours before the procedure - stop drinking clear liquids. Ask your health care provider about:  Changing or stopping your regular medicines. This is especially important if you are taking diabetes medicines or blood thinners.  Taking medicines such as ibuprofen. These medicines can thin your blood. Do not take these medicines before your procedure if your health care provider instructs you not to. Generally, aspirin  is recommended before a procedure of passing a small, thin tube (catheter) through a blood vessel and into the heart (cardiac catheterization). What happens during the procedure?  An IV tube will be inserted into one of your veins.  You  will be given one or more of the following:  A medicine to help you relax (sedative).  A medicine to numb the area where the catheter will be inserted into an artery (local anesthetic).  To reduce your risk of infection:  Your health care team will wash or sanitize their hands.  Your skin will be washed with soap.  Hair may be removed from the area where the catheter will be inserted.  Using a guide wire, the catheter will be inserted into an artery. The location may be in your groin, in your wrist, or in the fold of your arm (near your elbow).  A type of X-ray (fluoroscopy) will be used to help guide the catheter to the opening of the arteries in the heart.  A dye will be injected into the catheter, and X-rays will be taken. The dye will help to show where any narrowing or blockages are located in the arteries.  A tiny wire will be guided to the blocked spot, and a balloon will be inflated to make the artery wider.  The stent will be expanded and will crush the plaques into the wall of the vessel. The stent will hold the area open and improve the blood flow. Most stents have a drug coating to reduce the risk of the stent narrowing over time.  The artery may be made wider using a drill, laser, or other tools to remove plaques.  When the blood flow is better, the catheter will be removed. The lining of the artery will grow over the stent, which stays where it was placed. This procedure may vary among health care providers and hospitals. What happens after the procedure?  If the procedure is done through the leg, you will be kept in bed lying flat for about 6 hours. You will be instructed to not bend and not cross your legs.  The insertion site will be checked frequently.  The pulse in your foot or wrist will be checked frequently.  You may have additional blood tests, X-rays, and a test that records the electrical activity of your heart (electrocardiogram, or ECG). This  information is not intended to replace advice given to you by your health care provider. Make sure you discuss any questions you have with your health care provider. Document Released: 05/13/2003 Document Revised: 07/06/2016 Document Reviewed: 06/11/2016 Elsevier Interactive Patient Education  2017 [employer]    If you need a refill on your cardiac medications before your next appointment, please call your pharmacy.

## 2016-11-08 ENCOUNTER — Other Ambulatory Visit: Payer: 59

## 2016-11-14 ENCOUNTER — Other Ambulatory Visit: Payer: 59

## 2016-11-21 ENCOUNTER — Encounter (HOSPITAL_COMMUNITY)
Admission: RE | Disposition: A | Discharge status: Home / Self Care | Payer: Self-pay | Source: Ambulatory Visit | Attending: Cardiovascular Disease

## 2016-11-21 ENCOUNTER — Ambulatory Visit (HOSPITAL_COMMUNITY)
Admission: RE | Admit: 2016-11-21 | Discharge: 2016-11-21 | Disposition: A | Discharge status: Home / Self Care | Payer: 59 | Source: Ambulatory Visit | Attending: Cardiovascular Disease | Admitting: Cardiovascular Disease

## 2016-11-21 ENCOUNTER — Encounter (HOSPITAL_COMMUNITY): Payer: Self-pay | Admitting: Internal Medicine

## 2016-11-21 DIAGNOSIS — E785 Hyperlipidemia, unspecified: Secondary | ICD-10-CM | POA: Diagnosis not present

## 2016-11-21 DIAGNOSIS — R0789 Other chest pain: Secondary | ICD-10-CM | POA: Diagnosis not present

## 2016-11-21 DIAGNOSIS — E119 Type 2 diabetes mellitus without complications: Secondary | ICD-10-CM | POA: Insufficient documentation

## 2016-11-21 DIAGNOSIS — E7800 Pure hypercholesterolemia, unspecified: Secondary | ICD-10-CM | POA: Diagnosis not present

## 2016-11-21 DIAGNOSIS — Z87891 Personal history of nicotine dependence: Secondary | ICD-10-CM | POA: Diagnosis not present

## 2016-11-21 DIAGNOSIS — Z7982 Long term (current) use of aspirin: Secondary | ICD-10-CM | POA: Insufficient documentation

## 2016-11-21 DIAGNOSIS — Z7984 Long term (current) use of oral hypoglycemic drugs: Secondary | ICD-10-CM | POA: Insufficient documentation

## 2016-11-21 DIAGNOSIS — R9439 Abnormal result of other cardiovascular function study: Secondary | ICD-10-CM | POA: Diagnosis not present

## 2016-11-21 DIAGNOSIS — Z8371 Family history of colonic polyps: Secondary | ICD-10-CM | POA: Diagnosis not present

## 2016-11-21 DIAGNOSIS — I10 Essential (primary) hypertension: Secondary | ICD-10-CM | POA: Insufficient documentation

## 2016-11-21 DIAGNOSIS — Z8249 Family history of ischemic heart disease and other diseases of the circulatory system: Secondary | ICD-10-CM | POA: Insufficient documentation

## 2016-11-21 HISTORY — PX: CARDIAC CATHETERIZATION: SHX172

## 2016-11-21 LAB — PROTIME-INR
INR: 0.96
PROTHROMBIN TIME: 12.7 s (ref 11.4–15.2)

## 2016-11-21 LAB — BASIC METABOLIC PANEL
Anion gap: 8 (ref 5–15)
BUN: 17 mg/dL (ref 6–20)
CALCIUM: 9.1 mg/dL (ref 8.9–10.3)
CHLORIDE: 105 mmol/L (ref 101–111)
CO2: 25 mmol/L (ref 22–32)
CREATININE: 0.72 mg/dL (ref 0.61–1.24)
GFR calc non Af Amer: >60 mL/min (ref >60)
Glucose, Bld: 158 mg/dL — ABNORMAL HIGH (ref 65–99)
Potassium: 3.8 mmol/L (ref 3.5–5.1)
SODIUM: 138 mmol/L (ref 135–145)

## 2016-11-21 LAB — GLUCOSE, CAPILLARY: GLUCOSE-CAPILLARY: 169 mg/dL — AB (ref 65–99)

## 2016-11-21 LAB — CBC
HCT: 43 % (ref 39.0–52.0)
HEMOGLOBIN: 14.5 g/dL (ref 13.0–17.0)
MCH: 28.4 pg (ref 26.0–34.0)
MCHC: 33.7 g/dL (ref 30.0–36.0)
MCV: 84.1 fL (ref 78.0–100.0)
Platelets: 179 10*3/uL (ref 150–400)
RBC: 5.11 MIL/uL (ref 4.22–5.81)
RDW: 13.2 % (ref 11.5–15.5)
WBC: 6.6 10*3/uL (ref 4.0–10.5)

## 2016-11-21 SURGERY — LEFT HEART CATH AND CORONARY ANGIOGRAPHY
Anesthesia: LOCAL

## 2016-11-21 MED ORDER — SODIUM CHLORIDE 0.9 % WEIGHT BASED INFUSION: 1.0000 mL/kg/h | INTRAVENOUS | Status: DC

## 2016-11-21 MED ORDER — MIDAZOLAM HCL 2 MG/2ML IJ SOLN
INTRAMUSCULAR | Status: AC
  Filled 2016-11-21: qty 2

## 2016-11-21 MED ORDER — LIDOCAINE HCL (PF) 1 % IJ SOLN
INTRAMUSCULAR | Status: DC | PRN
Start: 2016-11-21 — End: 2016-11-21
  Administered 2016-11-21: 2 mL

## 2016-11-21 MED ORDER — SODIUM CHLORIDE 0.9% FLUSH: 3.0000 mL | INTRAVENOUS | Status: DC | PRN

## 2016-11-21 MED ORDER — METFORMIN HCL 1000 MG PO TABS: 1000.0000 mg | ORAL_TABLET | Freq: Two times a day (BID) | ORAL | Status: AC

## 2016-11-21 MED ORDER — SODIUM CHLORIDE 0.9 % IV SOLN: 250.0000 mL | INTRAVENOUS | Status: DC | PRN

## 2016-11-21 MED ORDER — FENTANYL CITRATE (PF) 100 MCG/2ML IJ SOLN
INTRAMUSCULAR | Status: DC | PRN
  Administered 2016-11-21: 50 ug via INTRAVENOUS

## 2016-11-21 MED ORDER — SODIUM CHLORIDE 0.9 % WEIGHT BASED INFUSION
3.0000 mL/kg/h | INTRAVENOUS | Status: AC
  Administered 2016-11-21: 3 mL/kg/h via INTRAVENOUS

## 2016-11-21 MED ORDER — FENTANYL CITRATE (PF) 100 MCG/2ML IJ SOLN
INTRAMUSCULAR | Status: AC
  Filled 2016-11-21: qty 2

## 2016-11-21 MED ORDER — VERAPAMIL HCL 2.5 MG/ML IV SOLN
INTRAVENOUS | Status: DC | PRN
  Administered 2016-11-21: 8 mL via INTRA_ARTERIAL

## 2016-11-21 MED ORDER — MIDAZOLAM HCL 2 MG/2ML IJ SOLN
INTRAMUSCULAR | Status: DC | PRN
  Administered 2016-11-21: 1 mg via INTRAVENOUS

## 2016-11-21 MED ORDER — SODIUM CHLORIDE 0.9 % IV SOLN: INTRAVENOUS | Status: DC

## 2016-11-21 MED ORDER — HEPARIN (PORCINE) IN NACL 2-0.9 UNIT/ML-% IJ SOLN
INTRAMUSCULAR | Status: DC | PRN
  Administered 2016-11-21: 1000 mL

## 2016-11-21 MED ORDER — HEPARIN SODIUM (PORCINE) 1000 UNIT/ML IJ SOLN
INTRAMUSCULAR | Status: AC
  Filled 2016-11-21: qty 1

## 2016-11-21 MED ORDER — ASPIRIN 81 MG PO CHEW
81.0000 mg | CHEWABLE_TABLET | ORAL | Status: AC
  Administered 2016-11-21: 81 mg via ORAL

## 2016-11-21 MED ORDER — ASPIRIN 81 MG PO CHEW
CHEWABLE_TABLET | ORAL | Status: AC
  Administered 2016-11-21: 81 mg via ORAL
  Filled 2016-11-21: qty 1

## 2016-11-21 MED ORDER — LIDOCAINE HCL (PF) 1 % IJ SOLN
INTRAMUSCULAR | Status: AC
  Filled 2016-11-21: qty 30

## 2016-11-21 MED ORDER — SODIUM CHLORIDE 0.9% FLUSH: 3.0000 mL | Freq: Two times a day (BID) | INTRAVENOUS | Status: DC

## 2016-11-21 MED ORDER — IOPAMIDOL (ISOVUE-370) INJECTION 76%
INTRAVENOUS | Status: DC | PRN
  Administered 2016-11-21: 70 mL via INTRA_ARTERIAL

## 2016-11-21 MED ORDER — VERAPAMIL HCL 2.5 MG/ML IV SOLN
INTRAVENOUS | Status: AC
  Filled 2016-11-21: qty 2

## 2016-11-21 MED ORDER — NITROGLYCERIN 1 MG/10 ML FOR IR/CATH LAB
INTRA_ARTERIAL | Status: AC
  Filled 2016-11-21: qty 10

## 2016-11-21 MED ORDER — HEPARIN (PORCINE) IN NACL 2-0.9 UNIT/ML-% IJ SOLN
INTRAMUSCULAR | Status: AC
  Filled 2016-11-21: qty 1000

## 2016-11-21 SURGICAL SUPPLY — 10 items
CATH IMPULSE 5F ANG/FL3.5 (CATHETERS) ×2 IMPLANT
DEVICE RAD COMP TR BAND LRG (VASCULAR PRODUCTS) ×2 IMPLANT
GLIDESHEATH SLEND SS 6F .021 (SHEATH) ×2 IMPLANT
GUIDEWIRE INQWIRE 1.5J.035X260 (WIRE) ×1 IMPLANT
INQWIRE 1.5J .035X260CM (WIRE) ×2
KIT HEART LEFT (KITS) ×2 IMPLANT
PACK CARDIAC CATHETERIZATION (CUSTOM PROCEDURE TRAY) ×2 IMPLANT
SYR MEDRAD MARK V 150ML (SYRINGE) ×2 IMPLANT
TRANSDUCER W/STOPCOCK (MISCELLANEOUS) ×2 IMPLANT
TUBING CIL FLEX 10 FLL-RA (TUBING) ×2 IMPLANT

## 2016-11-21 NOTE — Discharge Instructions (Signed)

## 2016-11-21 NOTE — Progress Notes (Signed)
Pt observed drinking black coffee. Pt reported being told by office that he could have clear liquids this morning. Vincent DikeJennifer, RN called and informed that pt drank about 8 ounces, will ask Dr. Clifton JamesMcAlhany about proceeding. Vincent Hawkins called back at [phone] with orders to proceed with preop but that pt needed labs so will postpone until later this morning.

## 2016-11-21 NOTE — Interval H&P Note (Signed)
History and Physical Interval Note:  11/21/2016 8:34 AM  Theophilus KindsJohn Muchmore  has presented today for cardiac catheterization, with the diagnosis of chest pain and abnormal stress test  The various methods of treatment have been discussed with the patient and family. After consideration of risks, benefits and other options for treatment, the patient has consented to  Procedure(s): Left Heart Cath and Coronary Angiography (N/A) as a surgical intervention .  The patient's history has been reviewed, patient examined, no change in status, stable for surgery.  I have reviewed the patient's chart and labs.  Questions were answered to the patient's satisfaction.    Cath Lab Visit (complete for each Cath Lab visit)  Clinical Evaluation Leading to the Procedure:   ACS: No.  Non-ACS:    Anginal Classification: CCS II  Anti-ischemic medical therapy: No Therapy  Non-Invasive Test Results: Intermediate-risk stress test findings: cardiac mortality 1-3%/year  Prior CABG: No previous CABG  Alexsis Kathman

## 2016-11-21 NOTE — H&P (View-Only) (Signed)
Cardiology Office Note    Date:  11/01/2016   ID:  Vincent KindsJohn Hawkins, DOB Mar 22, 1961, MRN [id number]  PCP:  Vincent Hawkins  Cardiologist:  New- Vincent Hawkins  CC: abnormal nuc- discuss cath  History of Present Illness:  Vincent Hawkins is a 56 y.o. male with a history of HTN, HLD, DMT2, and family history of CAD who presents to clinic to discuss an abnormal stress test.   Family hx GF with 3 MIs in his early 1950s. Other family with no heart disease. He was seen in the ER on 09/12/16 with left sided chest pain, left arm tingling, and lightheadedness. ECG was non ischemic and delta troponin negative. He was offered overnight observation but declined and wanted to follow up as an outpatient.  I saw him in clinic on 09/20/16 for evaluation of chest pain after being seen in the ER. I felt like his chest pain was atypical but with his many RFs for CAD we did a stress test. Nuclear stress test on 10/19/16 showed EF 52%, hypertensive response to exercise, medium size, moderate severity, reversible defect in the mid and apical inferior and basal and mid inferolateral walls consistent with ischemia in the LCX territory.  Today he presents to clinic to discuss abnormal nuc and possible cardiac cath. Dr. Melburn Hawkins came into room to discuss myoview and symptoms. He has had no further chest pain but no real exercise. 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 No CP , SOB, LE edema, orthopnea or PND. No palpitations. No dizziness or syncope. No blood in stool or urine.    Past Medical History:  Diagnosis Date  . Asthma    AS CHILD  . Diabetes mellitus without complication (HCC)   . High cholesterol   . Hypertension     Past Surgical History:  Procedure Laterality Date  . FOOT SURGERY Right   . KNEE ARTHROSCOPY WITH PATELLAR TENDON REPAIR Right   . WISDOM TOOTH EXTRACTION      Current Medications: Outpatient Medications Prior to Visit  Medication Sig Dispense Refill  .  ASA-APAP-Salicyl-Caff (PAINAID PO) Take 2 tablets by mouth as needed.     Marland Kitchen. aspirin EC 81 MG tablet Take 1 tablet (81 mg total) by mouth daily. 30 tablet 0  . Aspirin-Caffeine (BC FAST PAIN RELIEF PO) Take 1 packet by mouth daily as needed (pain).     Marland Kitchen. atorvastatin (LIPITOR) 40 MG tablet Take 40 mg by mouth daily.    Marland Kitchen. linagliptin (TRADJENTA) 5 MG TABS tablet Take 5 mg by mouth daily.    . metFORMIN (GLUCOPHAGE) 1000 MG tablet Take 1,000 mg by mouth 2 (two) times daily with a meal.    . Multiple Vitamins-Minerals (MULTIVITAMIN ADULT PO) Take 1 tablet by mouth daily.    . ramipril (ALTACE) 5 MG capsule Take 5 mg by mouth daily.     No facility-administered medications prior to visit.      Allergies:   Codeine   Social History   Social History  . Marital status: Married    Spouse name: N/A  . Number of children: 2  . Years of education: N/A   Occupational History  . RESTURANT MANAGER    Social History Main Topics  . Smoking status: Former Games developermoker  . Smokeless tobacco: Former NeurosurgeonUser    Types: Chew  . Alcohol use Yes  . Drug use: No  . Sexual activity: Not Asked   Other Topics Concern  . None  Social History Narrative  . None     Family History:  The patient's*family history includes Colon polyps in his father; Congestive Heart Failure in his paternal grandfather; Hypertension in his brother.     ROS:   Please see the history of present illness.    ROS All other systems reviewed and are negative.   PHYSICAL EXAM:   VS:  BP (!) 126/92   Pulse 78   Ht 5\' 10"  (1.778 m)   Wt 206 lb 12.8 oz (93.8 kg)   BMI 29.67 kg/m    GEN: Well nourished, well developed, in no acute distress  HEENT: normal  Neck: no JVD, carotid bruits, or masses Cardiac: RRR; no murmurs, rubs, or gallops,no edema  Respiratory:  clear to auscultation bilaterally, normal work of breathing GI: soft, nontender, nondistended, + BS MS: no deformity or atrophy  Skin: warm and dry, no rash Neuro:  Alert  and Oriented x 3, Strength and sensation are intact Psych: euthymic mood, full affect  Wt Readings from Last 3 Encounters:  11/01/16 206 lb 12.8 oz (93.8 kg)  10/19/16 206 lb (93.4 kg)  09/20/16 206 lb 6.4 oz (93.6 kg)      Studies/Labs Reviewed:   EKG:  EKG is NOT ordered today.  Recent Labs: 09/12/2016: BUN 13; Creatinine, Ser 0.89; Hemoglobin 15.8; Platelets 187; Potassium 4.0; Sodium 140   Lipid Panel No results found for: CHOL, TRIG, HDL, CHOLHDL, VLDL, LDLCALC, LDLDIRECT  Additional studies/ records that were reviewed today include:  CXR 09/12/16 IMPRESSION: No active cardiopulmonary disease.   10/19/16 Exercise nuclear stress test .   Nuclear stress EF: 52%.  Blood pressure demonstrated a hypertensive response to exercise.  There was no ST segment deviation noted during stress.  Defect 1: There is a medium defect of moderate severity present in the basal inferolateral, mid inferior, mid inferolateral and apical inferior location.  Findings consistent with ischemia.  This is an intermediate risk study.  The left ventricular ejection fraction is mildly decreased (45-54%).   There is medium size, moderate severity, reversible defect in the mid and apical inferior and basal and mid inferolateral walls consistent with ischemia in the LCX territory.  (SDS = 5)      ASSESSMENT & PLAN:   Abnormal nuclear stress test: he was initially seen for chest pain that felt like a muscular cramp in his chest. Due to his many RFs and family history he was referred for nuclear stress test which showed possible inferolateral ischemia. He has not had anymore chest pain since our last visit. Vincent Hawkins reviewed stress test and strongly feels like patient should have cath despite lack of recurrent symptoms. Patient would like to defer cath until early January due to very busy holiday schedule at work. He has been given a Rx for SL NTG with ER precautions if chest pain returns and  is not resolved with nitro.   I have reviewed the risks, indications, and alternatives to cardiac catheterization and possible angioplasty/stenting with the patient. Risks include but are not limited to bleeding, infection, vascular injury, stroke, myocardial infection, arrhythmia, kidney injury, radiation-related injury in the case of prolonged fluoroscopy use, emergency cardiac surgery, and death. The patient understands the risks of serious complication is low (<1%).   Will bring back for pre cath labs in the week before his cath on 11/22/15 with Dr. Sanjuana KavaMcAlhaney. Continue ASA and statin.     HTN: BP well controlled currently  HLD: continue statin   DMT2: continue current  regimen   Medication Adjustments/Labs and Tests Ordered: Current medicines are reviewed at length with the patient today.  Concerns regarding medicines are outlined above.  Medication changes, Labs and Tests ordered today are listed in the Patient Instructions below. Patient Instructions  Medication Instructions:  Your physician has recommended you make the following change in your medication:  1.  START the Nitroglycerin 0.4 s/l tablets AS NEEDED AND USE AS DIRECTED  Labwork: 11/14/16   BMET, CBC, & PT/INR  Testing/Procedures: Your physician has requested that you have a cardiac catheterization. Cardiac catheterization is used to diagnose and/or treat various heart conditions. Doctors may recommend this procedure for a number of different reasons. The most common reason is to evaluate chest pain. Chest pain can be a symptom of coronary artery disease (CAD), and cardiac catheterization can show whether plaque is narrowing or blocking your heart's arteries. This procedure is also used to evaluate the valves, as well as measure the blood flow and oxygen levels in different parts of your heart. For further information please visit [email or web address]. Please follow instruction sheet, as given.   Follow-Up: Your physician  recommends that you schedule a follow-up appointment in: WILL BE SET UP AT DISCHARGE  Any Other Special Instructions Will Be Listed Below (If Applicable).   Coronary Angiogram With Stent Coronary angiogram with stent placement is a procedure to widen or open a narrow blood vessel of the heart (coronary artery). Arteries may become blocked by cholesterol buildup (plaques) in the lining or wall. When a coronary artery becomes partially blocked, blood flow to that area decreases. This may lead to chest pain or a heart attack (myocardial infarction). A stent is a small piece of metal that looks like mesh or a spring. Stent placement may be done as treatment for a heart attack or right after a coronary angiogram in which a blocked artery is found. Let your health care provider know about:  Any allergies you have.  All medicines you are taking, including vitamins, herbs, eye drops, creams, and over-the-counter medicines.  Any problems you or family members have had with anesthetic medicines.  Any blood disorders you have.  Any surgeries you have had.  Any medical conditions you have.  Whether you are pregnant or may be pregnant. What are the risks? Generally, this is a safe procedure. However, problems may occur, including:  Damage to the heart or its blood vessels.  A return of blockage.  Bleeding, infection, or bruising at the insertion site.  A collection of blood under the skin (hematoma) at the insertion site.  A blood clot in another part of the body.  Kidney injury.  Allergic reaction to the dye or contrast that is used.  Bleeding into the abdomen (retroperitoneal bleeding). What happens before the procedure? Staying hydrated  Follow instructions from your health care provider about hydration, which may include:  Up to 2 hours before the procedure - you may continue to drink clear liquids, such as water, clear fruit juice, black coffee, and plain tea. Eating and  drinking restrictions  Follow instructions from your health care provider about eating and drinking, which may include:  8 hours before the procedure - stop eating heavy meals or foods such as meat, fried foods, or fatty foods.  6 hours before the procedure - stop eating light meals or foods, such as toast or cereal.  2 hours before the procedure - stop drinking clear liquids. Ask your health care provider about:  Changing or stopping your regular medicines.  This is especially important if you are taking diabetes medicines or blood thinners.  Taking medicines such as ibuprofen. These medicines can thin your blood. Do not take these medicines before your procedure if your health care provider instructs you not to. Generally, aspirin is recommended before a procedure of passing a small, thin tube (catheter) through a blood vessel and into the heart (cardiac catheterization). What happens during the procedure?  An IV tube will be inserted into one of your veins.  You will be given one or more of the following:  A medicine to help you relax (sedative).  A medicine to numb the area where the catheter will be inserted into an artery (local anesthetic).  To reduce your risk of infection:  Your health care team will wash or sanitize their hands.  Your skin will be washed with soap.  Hair may be removed from the area where the catheter will be inserted.  Using a guide wire, the catheter will be inserted into an artery. The location may be in your groin, in your wrist, or in the fold of your arm (near your elbow).  A type of X-ray (fluoroscopy) will be used to help guide the catheter to the opening of the arteries in the heart.  A dye will be injected into the catheter, and X-rays will be taken. The dye will help to show where any narrowing or blockages are located in the arteries.  A tiny wire will be guided to the blocked spot, and a balloon will be inflated to make the artery  wider.  The stent will be expanded and will crush the plaques into the wall of the vessel. The stent will hold the area open and improve the blood flow. Most stents have a drug coating to reduce the risk of the stent narrowing over time.  The artery may be made wider using a drill, laser, or other tools to remove plaques.  When the blood flow is better, the catheter will be removed. The lining of the artery will grow over the stent, which stays where it was placed. This procedure may vary among health care providers and hospitals. What happens after the procedure?  If the procedure is done through the leg, you will be kept in bed lying flat for about 6 hours. You will be instructed to not bend and not cross your legs.  The insertion site will be checked frequently.  The pulse in your foot or wrist will be checked frequently.  You may have additional blood tests, X-rays, and a test that records the electrical activity of your heart (electrocardiogram, or ECG). This information is not intended to replace advice given to you by your health care provider. Make sure you discuss any questions you have with your health care provider. Document Released: 05/13/2003 Document Revised: 07/06/2016 Document Reviewed: 06/11/2016 Elsevier Interactive Patient Education  2017 [employer].    If you need a refill on your cardiac medications before your next appointment, please call your pharmacy.      Signed, Cline Crock, PA-C  11/01/2016 12:17 PM    [facility] [address] [place], [place], [place]  [address] Phone: [phone]; Fax: [phone]

## 2016-11-22 MED FILL — Nitroglycerin IV Soln 100 MCG/ML in D5W: INTRA_ARTERIAL | Qty: 10 | Status: AC

## 2016-11-22 MED FILL — Heparin Sodium (Porcine) Inj 1000 Unit/ML: INTRAMUSCULAR | Qty: 10 | Status: AC

## 2016-12-15 IMAGING — NM NM MISC PROCEDURE
3 series · 18 of 18 positions shown · non-contrast
Comparison: none

[Series 1: rest_(id)_sa · 6.4mm · 6.40mm/px · 6 of 64 frames shown]
[frame 6/64]
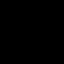
[frame 16/64]
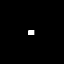
[frame 27/64]
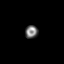
[frame 38/64]
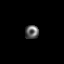
[frame 48/64]
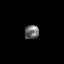
[frame 59/64]
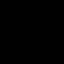

[Series 1: stress-gsp_(id)_sa · 6.4mm · 6.40mm/px · 6 of 512 frames shown]
[frame 43/512]
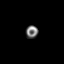
[frame 128/512]
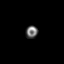
[frame 214/512]
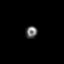
[frame 299/512]
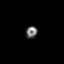
[frame 384/512]
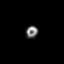
[frame 470/512]
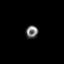

[Series 1: stress-sum-em_(id)_sa · 6.4mm · 6.40mm/px · 6 of 64 frames shown]
[frame 6/64]
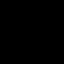
[frame 16/64]
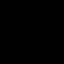
[frame 27/64]
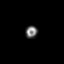
[frame 38/64]
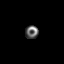
[frame 48/64]
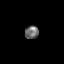
[frame 59/64]
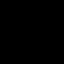

[18 of 18 positions shown; findings below may reference images not displayed]

Canned report from images found in remote index.

Refer to host system for actual result text.

## 2017-03-27 DIAGNOSIS — I10 Essential (primary) hypertension: Secondary | ICD-10-CM | POA: Diagnosis not present

## 2017-03-27 DIAGNOSIS — Z125 Encounter for screening for malignant neoplasm of prostate: Secondary | ICD-10-CM | POA: Diagnosis not present

## 2017-03-27 DIAGNOSIS — E1165 Type 2 diabetes mellitus with hyperglycemia: Secondary | ICD-10-CM | POA: Diagnosis not present

## 2017-03-27 DIAGNOSIS — E785 Hyperlipidemia, unspecified: Secondary | ICD-10-CM | POA: Diagnosis not present

## 2017-03-27 DIAGNOSIS — Z0000 Encounter for general adult medical examination without abnormal findings: Secondary | ICD-10-CM | POA: Diagnosis not present

## 2017-03-28 DIAGNOSIS — E119 Type 2 diabetes mellitus without complications: Secondary | ICD-10-CM | POA: Diagnosis not present
